# Patient Record
Sex: Male | Born: 1995 | Race: Black or African American | Hispanic: No | Marital: Single | Smoking: Current some day smoker
Health system: Southern US, Community
[De-identification: ages and names within clinical notes are randomized; demographics above are authoritative.]

## PROBLEM LIST (undated history)

## (undated) HISTORY — PX: TONSILLECTOMY: SUR1361

---

## 2018-03-09 ENCOUNTER — Encounter (HOSPITAL_COMMUNITY): Payer: Self-pay

## 2018-03-09 ENCOUNTER — Emergency Department (HOSPITAL_COMMUNITY)
Admission: EM | Admit: 2018-03-09 | Discharge: 2018-03-10 | Disposition: A | Discharge status: Home / Self Care | Payer: Self-pay | Attending: Emergency Medicine | Admitting: Emergency Medicine

## 2018-03-09 DIAGNOSIS — R112 Nausea with vomiting, unspecified: Secondary | ICD-10-CM | POA: Insufficient documentation

## 2018-03-09 NOTE — ED Notes (Signed)
Pt called for v/s, no response from the lobby

## 2018-03-09 NOTE — ED Triage Notes (Signed)
Pt complains of vomiting since yesterday, he also complains of abdominal pain and back pain

## 2018-03-10 LAB — COMPREHENSIVE METABOLIC PANEL
ALT: 19 U/L (ref 0–44)
AST: 23 U/L (ref 15–41)
Albumin: 4.4 g/dL (ref 3.5–5.0)
Alkaline Phosphatase: 52 U/L (ref 38–126)
Anion gap: 11 (ref 5–15)
BILIRUBIN TOTAL: 2.7 mg/dL — AB (ref 0.3–1.2)
BUN: 12 mg/dL (ref 6–20)
CHLORIDE: 101 mmol/L (ref 98–111)
CO2: 26 mmol/L (ref 22–32)
Calcium: 9.2 mg/dL (ref 8.9–10.3)
Creatinine, Ser: 0.92 mg/dL (ref 0.61–1.24)
Glucose, Bld: 85 mg/dL (ref 70–99)
POTASSIUM: 3.6 mmol/L (ref 3.5–5.1)
Sodium: 138 mmol/L (ref 135–145)
TOTAL PROTEIN: 7.5 g/dL (ref 6.5–8.1)

## 2018-03-10 LAB — CBC
HEMATOCRIT: 43.1 % (ref 39.0–52.0)
Hemoglobin: 15.1 g/dL (ref 13.0–17.0)
MCH: 30.6 pg (ref 26.0–34.0)
MCHC: 35 g/dL (ref 30.0–36.0)
MCV: 87.4 fL (ref 78.0–100.0)
PLATELETS: 214 10*3/uL (ref 150–400)
RBC: 4.93 MIL/uL (ref 4.22–5.81)
RDW: 12.9 % (ref 11.5–15.5)
WBC: 12.8 10*3/uL — AB (ref 4.0–10.5)

## 2018-03-10 LAB — LIPASE, BLOOD: LIPASE: 25 U/L (ref 11–51)

## 2018-03-10 MED ORDER — SODIUM CHLORIDE 0.9 % IV BOLUS
1000.0000 mL | Freq: Once | INTRAVENOUS | Status: AC
  Administered 2018-03-10: 1000 mL via INTRAVENOUS

## 2018-03-10 MED ORDER — ACETAMINOPHEN 325 MG PO TABS
650.0000 mg | ORAL_TABLET | Freq: Once | ORAL | Status: AC
  Administered 2018-03-10: 650 mg via ORAL
  Filled 2018-03-10: qty 2

## 2018-03-10 MED ORDER — ONDANSETRON 8 MG PO TBDP: 8.0000 mg | ORAL_TABLET | Freq: Three times a day (TID) | ORAL | 0 refills | Status: AC | PRN

## 2018-03-10 MED ORDER — ONDANSETRON HCL 4 MG/2ML IJ SOLN
4.0000 mg | Freq: Once | INTRAMUSCULAR | Status: AC
  Administered 2018-03-10: 4 mg via INTRAVENOUS
  Filled 2018-03-10: qty 2

## 2018-03-10 NOTE — ED Notes (Signed)
Discharge instructions reviewed with pt. PT verbalized understanding. Pt to follow up with PCP. PIV removed. Pt ambulatory to waiting room.

## 2018-03-10 NOTE — ED Notes (Signed)
Discharge instructions reviewed with pt. PT verbalized understanding. Pt to follow up with PCP. PIV removed. PT ambulatory to waiting room.

## 2018-03-10 NOTE — ED Provider Notes (Signed)
[facility]-EMERGENCY DEPT Provider Note   CSN: [id number] Arrival date & time: 03/09/18  2141     History   Chief Complaint Chief Complaint  Patient presents with  . Emesis    HPI William House is a 22 y.o. male.  HPI 22 year old male presents the emergency department with complaints of nausea vomiting without diarrhea since yesterday.  Inability to keep fluids down yesterday.  Low-grade fevers.  No recent sick contacts.  Denies diarrhea.  Reports crampy abdominal pain.  No chest pain or shortness of breath.  Symptoms are mild to moderate in severity   History reviewed. No pertinent past medical history.  There are no active problems to display for this patient.   History reviewed. No pertinent surgical history.      Home Medications    Prior to Admission medications   Medication Sig Start Date End Date Taking? Authorizing Provider  ondansetron (ZOFRAN ODT) 8 MG disintegrating tablet Take 1 tablet (8 mg total) by mouth every 8 (eight) hours as needed for nausea or vomiting. 03/10/18   Azalia Bilisampos, Timoteo Carreiro, MD    Family History History reviewed. No pertinent family history.  Social History Social History   Tobacco Use  . Smoking status: Never Smoker  . Smokeless tobacco: Never Used  Substance Use Topics  . Alcohol use: Never    Frequency: Never  . Drug use: Never     Allergies   Amoxicillin and Penicillins   Review of Systems Review of Systems  All other systems reviewed and are negative.    Physical Exam Updated Vital Signs BP 113/62 (BP Location: Left Arm)   Pulse 71   Temp 97.9 F (36.6 C) (Oral)   Resp 18   SpO2 100%   Physical Exam  Constitutional: He is oriented to person, place, and time.  HENT:  Head: Normocephalic and atraumatic.  Eyes: EOM are normal.  Neck: Normal range of motion.  Cardiovascular: Normal rate, regular rhythm and intact distal pulses.  Pulmonary/Chest: Effort normal and breath sounds normal. No  respiratory distress.  Abdominal: He exhibits no distension. There is no tenderness.  Musculoskeletal: Normal range of motion.  Neurological: He is alert and oriented to person, place, and time.  Skin: Skin is warm and dry.  Nursing note and vitals reviewed.    ED Treatments / Results  Labs (all labs ordered are listed, but only abnormal results are displayed) Labs Reviewed  COMPREHENSIVE METABOLIC PANEL - Abnormal; Notable for the following components:      Result Value   Total Bilirubin 2.7 (*)    All other components within normal limits  CBC - Abnormal; Notable for the following components:   WBC 12.8 (*)    All other components within normal limits  LIPASE, BLOOD  URINALYSIS, ROUTINE W REFLEX MICROSCOPIC    EKG None  Radiology No results found.  Procedures Procedures (including critical care time)  Medications Ordered in ED Medications  ondansetron (ZOFRAN) injection 4 mg (4 mg Intravenous Given 03/10/18 0310)  sodium chloride 0.9 % bolus 1,000 mL (0 mLs Intravenous Stopped 03/10/18 0411)  acetaminophen (TYLENOL) tablet 650 mg (650 mg Oral Given 03/10/18 0554)     Initial Impression / Assessment and Plan / ED Course  I have reviewed the triage vital signs and the nursing notes.  Pertinent labs & imaging results that were available during my care of the patient were reviewed by me and considered in my medical decision making (see chart for details).  Overall well-appearing.  Feels better after IV fluids and nausea medicine.  Home with prescription for nausea medicine.  Repeat abdominal exam without focal tenderness.  No indication for advanced imaging.   Final Clinical Impressions(s) / ED Diagnoses   Final diagnoses:  Nausea and vomiting, intractability of vomiting not specified, unspecified vomiting type    ED Discharge Orders         Ordered    ondansetron (ZOFRAN ODT) 8 MG disintegrating tablet  Every 8 hours PRN     03/10/18 0548             Azalia Bilisampos, Patric Vanpelt, MD 03/10/18 [phone]

## 2018-03-10 NOTE — ED Notes (Signed)
ED Provider at bedside. 

## 2018-05-01 ENCOUNTER — Emergency Department (HOSPITAL_COMMUNITY): Payer: Self-pay

## 2018-05-01 ENCOUNTER — Other Ambulatory Visit: Payer: Self-pay

## 2018-05-01 ENCOUNTER — Encounter (HOSPITAL_COMMUNITY): Payer: Self-pay | Admitting: *Deleted

## 2018-05-01 ENCOUNTER — Emergency Department (HOSPITAL_COMMUNITY)
Admission: EM | Admit: 2018-05-01 | Discharge: 2018-05-01 | Disposition: A | Discharge status: Home / Self Care | Payer: Self-pay | Attending: Emergency Medicine | Admitting: Emergency Medicine

## 2018-05-01 DIAGNOSIS — F17210 Nicotine dependence, cigarettes, uncomplicated: Secondary | ICD-10-CM | POA: Insufficient documentation

## 2018-05-01 DIAGNOSIS — R0789 Other chest pain: Secondary | ICD-10-CM | POA: Insufficient documentation

## 2018-05-01 LAB — CBC
HCT: 43.5 % (ref 39.0–52.0)
HEMOGLOBIN: 14.9 g/dL (ref 13.0–17.0)
MCH: 30.3 pg (ref 26.0–34.0)
MCHC: 34.3 g/dL (ref 30.0–36.0)
MCV: 88.4 fL (ref 78.0–100.0)
Platelets: 252 10*3/uL (ref 150–400)
RBC: 4.92 MIL/uL (ref 4.22–5.81)
RDW: 13.3 % (ref 11.5–15.5)
WBC: 7.4 10*3/uL (ref 4.0–10.5)

## 2018-05-01 LAB — BASIC METABOLIC PANEL
ANION GAP: 10 (ref 5–15)
BUN: 9 mg/dL (ref 6–20)
CALCIUM: 9 mg/dL (ref 8.9–10.3)
CO2: 27 mmol/L (ref 22–32)
CREATININE: 1.08 mg/dL (ref 0.61–1.24)
Chloride: 105 mmol/L (ref 98–111)
GLUCOSE: 87 mg/dL (ref 70–99)
Potassium: 4.1 mmol/L (ref 3.5–5.1)
Sodium: 142 mmol/L (ref 135–145)

## 2018-05-01 LAB — I-STAT TROPONIN, ED: TROPONIN I, POC: 0 ng/mL (ref 0.00–0.08)

## 2018-05-01 NOTE — ED Provider Notes (Signed)
[facility]-EMERGENCY DEPT Provider Note   CSN: [id number] Arrival date & time: 05/01/18  1754     History   Chief Complaint Chief Complaint  Patient presents with  . Chest Pain    HPI William House is a 22 y.o. male.  The history is provided by the patient. No language interpreter was used.  Chest Pain     William House is a 22 y.o. male who presents to the Emergency Department complaining of chest pain. He presents to the emergency department complaining of chest pain for the last two days. Pain is located in the left chest and is described as sharp in nature. It is worse with deep breaths. He denies any associated fevers, cough, abdominal pain, nausea, leg swelling or pain. He does have some mild shortness of breath. No prior similar symptoms. He has a history of anemia. No history of blood clots. He has a family history of cardiac disease and his grandmother. He smokes occasional tobacco and uses occasional alcohol. He does use marijuana. Past Medical History:  Diagnosis Date  . Premature birth     There are no active problems to display for this patient.   Past Surgical History:  Procedure Laterality Date  . TONSILLECTOMY          Home Medications    Prior to Admission medications   Medication Sig Start Date End Date Taking? Authorizing Provider  ondansetron (ZOFRAN ODT) 8 MG disintegrating tablet Take 1 tablet (8 mg total) by mouth every 8 (eight) hours as needed for nausea or vomiting. Patient not taking: Reported on 05/01/2018 03/10/18   Azalia Bilis, MD    Family History No family history on file.  Social History Social History   Tobacco Use  . Smoking status: Current Some Day Smoker    Types: Cigarettes  . Smokeless tobacco: Never Used  Substance Use Topics  . Alcohol use: Yes    Frequency: Never    Comment: occasional  . Drug use: Yes    Types: Marijuana    Comment: Last use 04/30/18     Allergies   Amoxicillin and  Penicillins   Review of Systems Review of Systems  Cardiovascular: Positive for chest pain.  All other systems reviewed and are negative.    Physical Exam Updated Vital Signs BP 130/81 (BP Location: Left Arm)   Pulse 76   Temp 98.6 F (37 C) (Oral)   Resp 16   Ht 5\' 11"  (1.803 m)   Wt 59.9 kg   SpO2 100%   BMI 18.41 kg/m   Physical Exam  Constitutional: He is oriented to person, place, and time. He appears well-developed and well-nourished.  HENT:  Head: Normocephalic and atraumatic.  Cardiovascular: Normal rate and regular rhythm.  No murmur heard. Pulmonary/Chest: Effort normal and breath sounds normal. No respiratory distress. He exhibits tenderness.  Tender to palpation over the left anterior chest wall without any palpable masses.  Abdominal: Soft. There is no tenderness. There is no rebound and no guarding.  Musculoskeletal: He exhibits no edema or tenderness.  Neurological: He is alert and oriented to person, place, and time.  Skin: Skin is warm and dry.  Psychiatric: He has a normal mood and affect. His behavior is normal.  Nursing note and vitals reviewed.    ED Treatments / Results  Labs (all labs ordered are listed, but only abnormal results are displayed) Labs Reviewed  BASIC METABOLIC PANEL  CBC  I-STAT TROPONIN, ED    EKG  None ED ECG REPORT   Date: 05/01/2018  Rate: 67  Rhythm: normal sinus rhythm  QRS Axis: normal  Intervals: normal  St elevation in V 3 c/w early repolarization.  RSR' in V1, V2  Narrative Interpretation:   Old EKG Reviewed: none available  I have personally reviewed the EKG tracing and agree with the computerized printout as noted.  Radiology Dg Chest 2 View  Result Date: 05/01/2018 CLINICAL DATA:  Chest pain x2 days EXAM: CHEST - 2 VIEW COMPARISON:  None. FINDINGS: The heart size and mediastinal contours are within normal limits. Both lungs are clear. No pneumothorax or pulmonary consolidation. No effusion. The  visualized skeletal structures are unremarkable. IMPRESSION: No active cardiopulmonary disease. Electronically Signed   By: Tollie Eth M.D.   On: 05/01/2018 18:27    Procedures Procedures (including critical care time)  Medications Ordered in ED Medications - No data to display   Initial Impression / Assessment and Plan / ED Course  I have reviewed the triage vital signs and the nursing notes.  Pertinent labs & imaging results that were available during my care of the patient were reviewed by me and considered in my medical decision making (see chart for details).     Patient here for evaluation of two days of chest pain. Pain is reproducible on examination. EKG without acute ischemic changes. Presentation is not consistent with ACS, PE, dissection. Discussed with patient home care for chest wall pain. Discussed outpatient follow-up and return precautions.  Final Clinical Impressions(s) / ED Diagnoses   Final diagnoses:  Chest wall pain    ED Discharge Orders    None       Tilden Fossa, MD 05/01/18 1935

## 2018-05-01 NOTE — Discharge Instructions (Addendum)
You can take ibuprofen, available over the counter according to label instructions as needed for pain.  Get rechecked immediately if you develop any new or concerning symptoms.

## 2018-05-01 NOTE — ED Triage Notes (Signed)
Pt reports chest pain x 2 days.  Pt reports that he does get a little SOB on exertion.  Pt reports a small bump close to the base of the sternum on left side that is painful to palpation.  Pt reports pain radiates to the back.  Pt has not taken any medications to help mitigate pain.

## 2019-01-09 ENCOUNTER — Encounter (HOSPITAL_BASED_OUTPATIENT_CLINIC_OR_DEPARTMENT_OTHER): Payer: Self-pay | Admitting: Adult Health

## 2019-01-09 ENCOUNTER — Other Ambulatory Visit: Payer: Self-pay

## 2019-01-09 ENCOUNTER — Emergency Department (HOSPITAL_BASED_OUTPATIENT_CLINIC_OR_DEPARTMENT_OTHER)
Admission: EM | Admit: 2019-01-09 | Discharge: 2019-01-10 | Disposition: A | Discharge status: Home / Self Care | Payer: No Typology Code available for payment source | Attending: Emergency Medicine | Admitting: Emergency Medicine

## 2019-01-09 ENCOUNTER — Emergency Department (HOSPITAL_BASED_OUTPATIENT_CLINIC_OR_DEPARTMENT_OTHER): Payer: No Typology Code available for payment source

## 2019-01-09 DIAGNOSIS — Y9389 Activity, other specified: Secondary | ICD-10-CM | POA: Insufficient documentation

## 2019-01-09 DIAGNOSIS — W228XXA Striking against or struck by other objects, initial encounter: Secondary | ICD-10-CM | POA: Insufficient documentation

## 2019-01-09 DIAGNOSIS — Y9289 Other specified places as the place of occurrence of the external cause: Secondary | ICD-10-CM | POA: Insufficient documentation

## 2019-01-09 DIAGNOSIS — Y990 Civilian activity done for income or pay: Secondary | ICD-10-CM | POA: Diagnosis not present

## 2019-01-09 DIAGNOSIS — M25561 Pain in right knee: Secondary | ICD-10-CM | POA: Insufficient documentation

## 2019-01-09 DIAGNOSIS — F17210 Nicotine dependence, cigarettes, uncomplicated: Secondary | ICD-10-CM | POA: Insufficient documentation

## 2019-01-09 MED ORDER — IBUPROFEN 400 MG PO TABS
600.0000 mg | ORAL_TABLET | Freq: Once | ORAL | Status: AC
  Administered 2019-01-09: 600 mg via ORAL
  Filled 2019-01-09: qty 1

## 2019-01-09 MED ORDER — HYDROCODONE-ACETAMINOPHEN 5-325 MG PO TABS
1.0000 | ORAL_TABLET | Freq: Once | ORAL | Status: AC
  Administered 2019-01-09: 1 via ORAL
  Filled 2019-01-09: qty 1

## 2019-01-09 NOTE — ED Provider Notes (Signed)
[facility] EMERGENCY DEPARTMENT Provider Note   CSN: [id number] Arrival date & time: 01/09/19  2222     History   Chief Complaint Chief Complaint  Patient presents with  . Knee Injury    HPI William House is a 23 y.o. male.     HPI   22yM with R knee pain. Onset while at work earlier today. Works for [employer]. A large rolled rug was slung into the side of his knee. He has had persistent pain since then. Worse with movement. No other injuries.   Past Medical History:  Diagnosis Date  . Premature birth     There are no active problems to display for this patient.   Past Surgical History:  Procedure Laterality Date  . TONSILLECTOMY          Home Medications    Prior to Admission medications   Medication Sig Start Date End Date Taking? Authorizing Provider  ondansetron (ZOFRAN ODT) 8 MG disintegrating tablet Take 1 tablet (8 mg total) by mouth every 8 (eight) hours as needed for nausea or vomiting. Patient not taking: Reported on 05/01/2018 03/10/18   Azalia Bilisampos, Kevin, MD    Family History History reviewed. No pertinent family history.  Social History Social History   Tobacco Use  . Smoking status: Current Some Day Smoker    Types: Cigarettes  . Smokeless tobacco: Never Used  Substance Use Topics  . Alcohol use: Yes    Frequency: Never    Comment: occasional  . Drug use: Yes    Types: Marijuana    Comment: Last use 04/30/18     Allergies   Amoxicillin and Penicillins   Review of Systems Review of Systems  All systems reviewed and negative, other than as noted in HPI.  Physical Exam Updated Vital Signs BP 131/87   Pulse 71   Temp 98.2 F (36.8 C) (Oral)   Resp 18   Ht 5\' 11"  (1.803 m)   Wt 62.6 kg   SpO2 100%   BMI 19.25 kg/m   Physical Exam Vitals signs and nursing note reviewed.  Constitutional:      General: He is not in acute distress.    Appearance: He is well-developed.  HENT:     Head: Normocephalic and atraumatic.   Eyes:     General:        Right eye: No discharge.        Left eye: No discharge.     Conjunctiva/sclera: Conjunctivae normal.  Neck:     Musculoskeletal: Neck supple.  Cardiovascular:     Rate and Rhythm: Normal rate and regular rhythm.     Heart sounds: Normal heart sounds. No murmur. No friction rub. No gallop.   Pulmonary:     Effort: Pulmonary effort is normal. No respiratory distress.     Breath sounds: Normal breath sounds.  Abdominal:     General: There is no distension.     Palpations: Abdomen is soft.     Tenderness: There is no abdominal tenderness.  Musculoskeletal:        General: Tenderness present.     Comments: Mild TTP lateral R knee. No effusion. No laxity appreciated. Can actively range although with increased pain. NVI.   Skin:    General: Skin is warm and dry.  Neurological:     Mental Status: He is alert.  Psychiatric:        Behavior: Behavior normal.        Thought Content: Thought content  normal.      ED Treatments / Results  Labs (all labs ordered are listed, but only abnormal results are displayed) Labs Reviewed - No data to display  EKG    Radiology No results found.  Procedures Procedures (including critical care time)  Medications Ordered in ED Medications  ibuprofen (ADVIL) tablet 600 mg (has no administration in time range)  HYDROcodone-acetaminophen (NORCO/VICODIN) 5-325 MG per tablet 1 tablet (has no administration in time range)     Initial Impression / Assessment and Plan / ED Course  I have reviewed the triage vital signs and the nursing notes.  Pertinent labs & imaging results that were available during my care of the patient were reviewed by me and considered in my medical decision making (see chart for details).        22yM with R knee pain. Likely contusion. NVI. Will image.   Final Clinical Impressions(s) / ED Diagnoses   Final diagnoses:  Acute pain of right knee    ED Discharge Orders    None        Virgel Manifold, MD 01/10/19 1126

## 2019-01-09 NOTE — ED Provider Notes (Signed)
I assumed care of this patient from Dr. Wilson Singer at 2300.  Please see their note for further details of Hx, PE.  Briefly patient is a 23 y.o. male who presented with right knee injury pending plain film.   Xray negative for fracture or dislocation. Knee sleeve provided.  The patient is safe for discharge with strict return precautions.  Disposition: Discharge  Condition: Good  I have discussed the results, Dx and Tx plan with the patient who expressed understanding and agree(s) with the plan. Discharge instructions discussed at great length. The patient was given strict return precautions who verbalized understanding of the instructions. No further questions at time of discharge.     Fatima Blank, MD 01/09/19 [phone]

## 2019-01-09 NOTE — ED Triage Notes (Signed)
Presents with right knee injury from a packaged rug that hit right knee from the lateral side, he reprots that he hit the floor due to pain. Unable to bear weight on knee since the injury at 1 am today.

## 2020-10-17 IMAGING — DX RIGHT KNEE - COMPLETE 4+ VIEW
4 series · 4 of 4 positions shown · non-contrast
Comparison: None.

CLINICAL DATA: Right knee injury, lateral knee pain

EXAM:
RIGHT KNEE - COMPLETE 4+ VIEW

[knee ap]
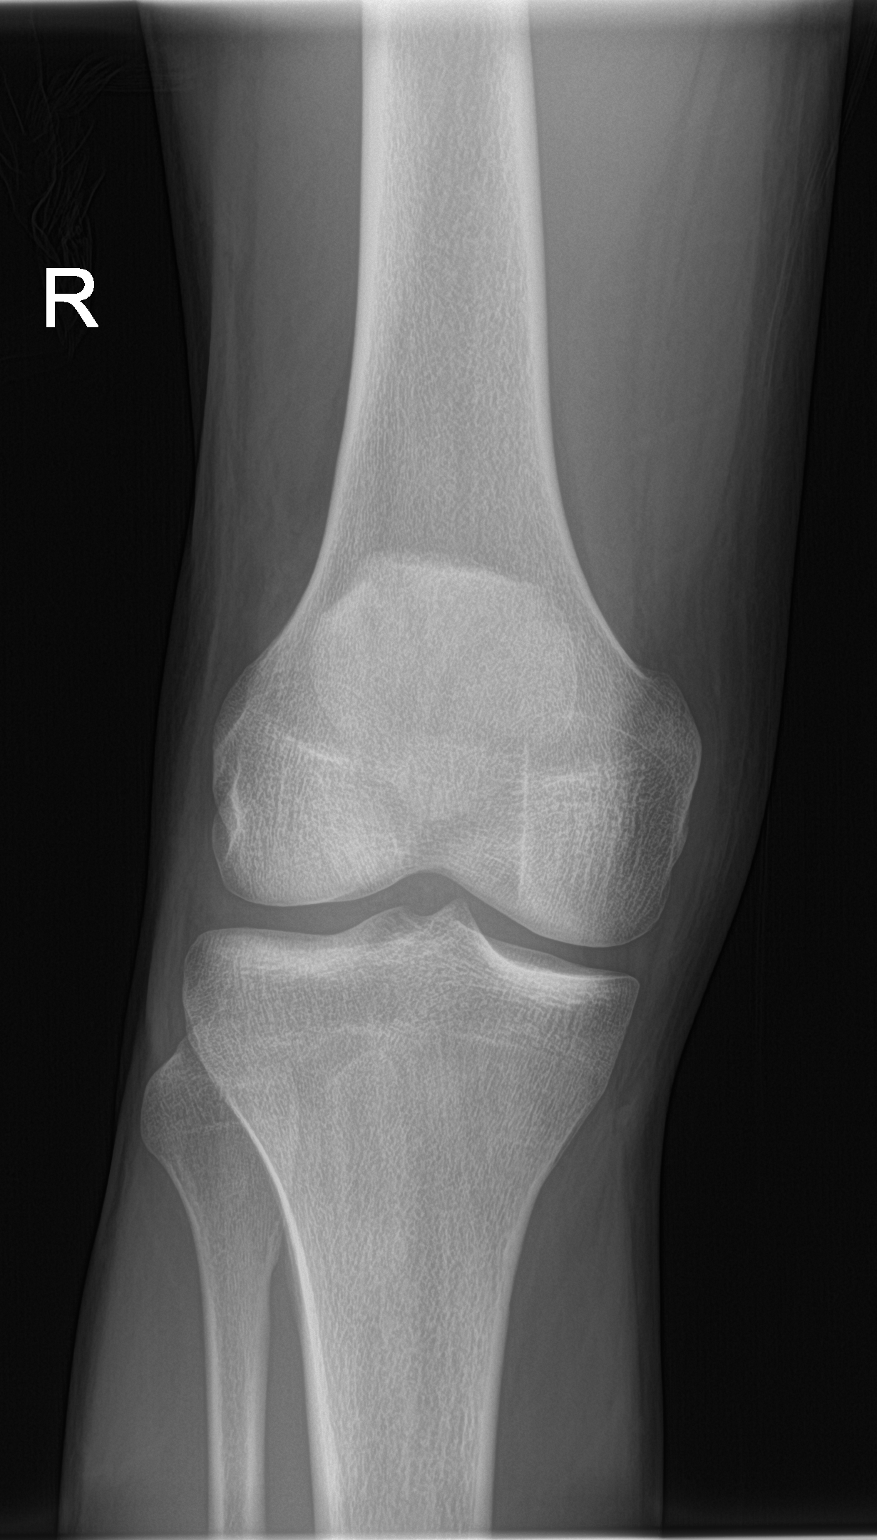

[knee lat]
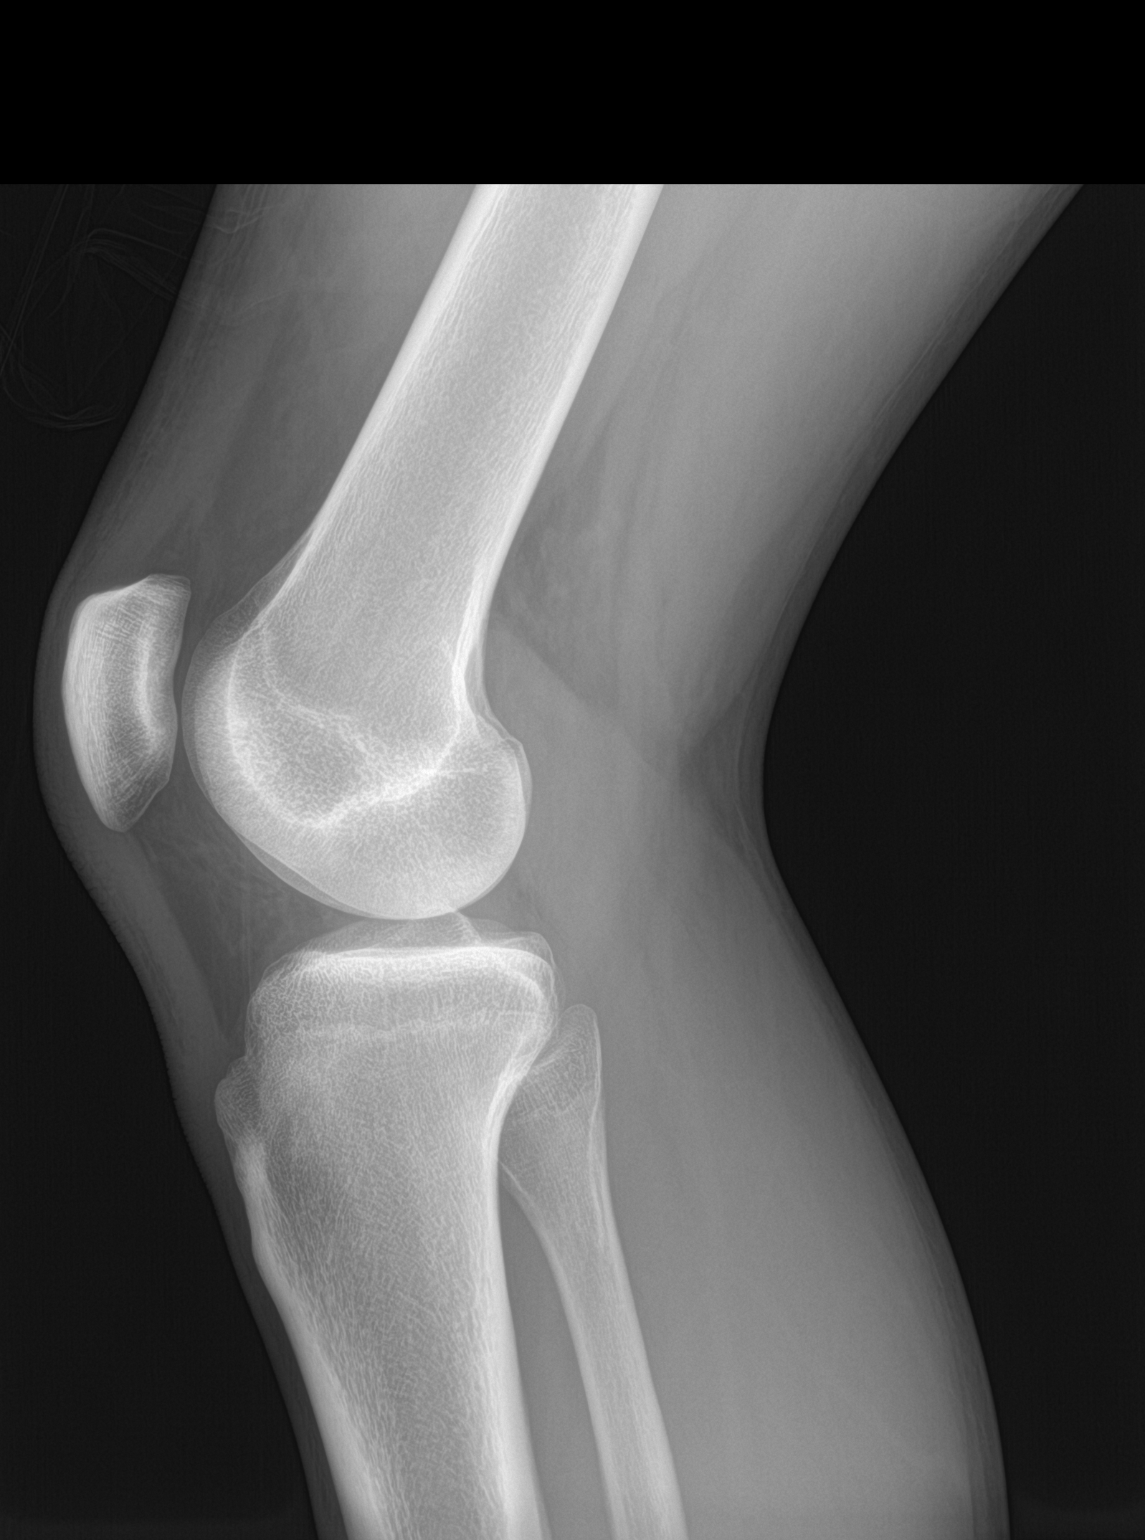

[knee obl (1 of 2)]
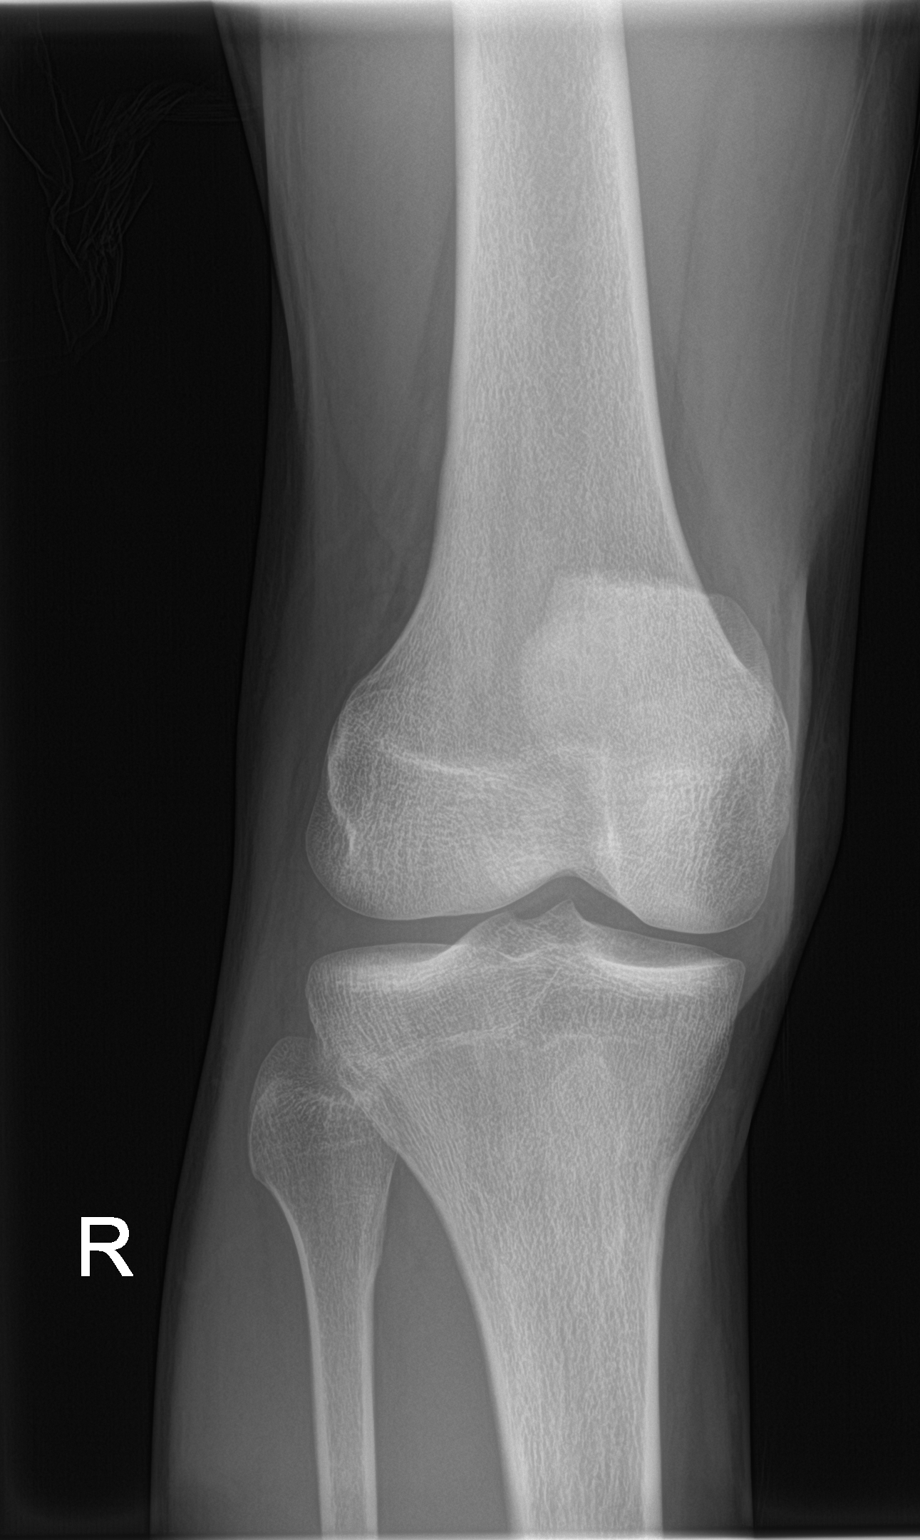

[knee obl (2 of 2)]
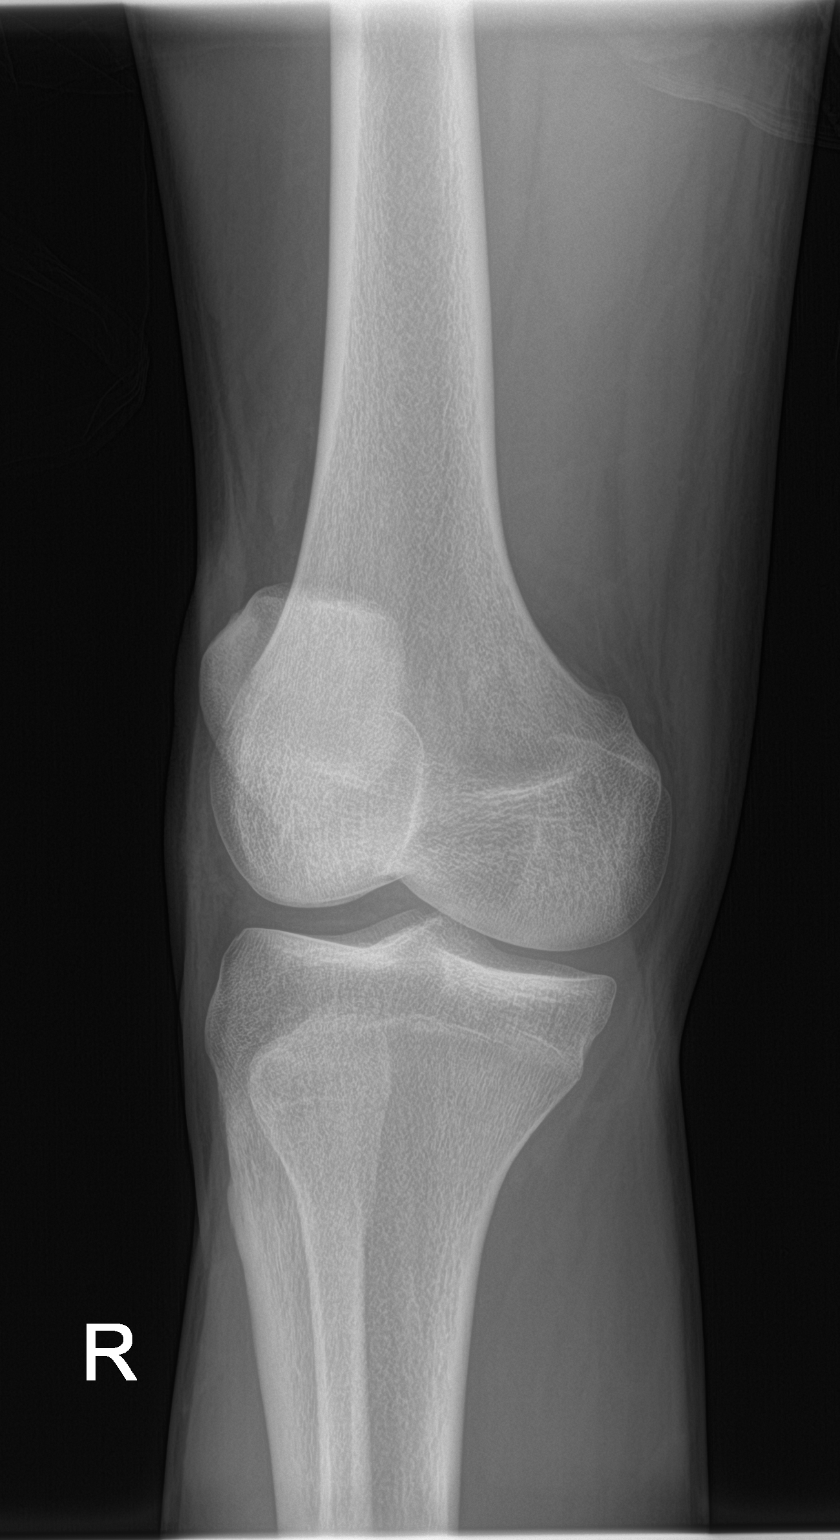

[4 of 4 positions shown; findings below may reference images not displayed]

FINDINGS: No evidence of fracture, dislocation, or joint effusion. No evidence
of arthropathy or other focal bone abnormality. Soft tissues are
unremarkable.
IMPRESSION: Negative.
